# Patient Record
Sex: Female | Born: 1955 | Race: White | Hispanic: No | Marital: Married
Health system: Southern US, Community
[De-identification: ages and names within clinical notes are randomized; demographics above are authoritative.]

---

## 2011-06-16 ENCOUNTER — Ambulatory Visit: Payer: Self-pay | Admitting: Oncology

## 2011-07-17 ENCOUNTER — Ambulatory Visit: Payer: Self-pay | Admitting: Oncology

## 2011-07-20 LAB — CBC CANCER CENTER
Basophil #: 0.2 x10 3/mm — ABNORMAL HIGH (ref 0.0–0.1)
Eosinophil #: 0 x10 3/mm (ref 0.0–0.7)
Eosinophil %: 0.4 %
MCH: 30.9 pg (ref 26.0–34.0)
MCHC: 33.4 g/dL (ref 32.0–36.0)
MCV: 92 fL (ref 80–100)
Monocyte #: 0.8 x10 3/mm (ref 0.2–0.9)
Monocyte %: 10.3 %
Neutrophil %: 65 %
Platelet: 226 x10 3/mm (ref 150–440)
RBC: 4.41 10*6/uL (ref 3.80–5.20)
WBC: 7.5 x10 3/mm (ref 3.6–11.0)

## 2011-07-20 LAB — COMPREHENSIVE METABOLIC PANEL
Albumin: 3.8 g/dL (ref 3.4–5.0)
Alkaline Phosphatase: 106 U/L (ref 50–136)
BUN: 9 mg/dL (ref 7–18)
Chloride: 104 mmol/L (ref 98–107)
Creatinine: 0.66 mg/dL (ref 0.60–1.30)
EGFR (Non-African Amer.): >60
Glucose: 75 mg/dL (ref 65–99)
Osmolality: 275 (ref 275–301)
Potassium: 4 mmol/L (ref 3.5–5.1)
Total Protein: 7.7 g/dL (ref 6.4–8.2)

## 2011-08-16 ENCOUNTER — Ambulatory Visit: Payer: Self-pay | Admitting: Oncology

## 2011-09-02 ENCOUNTER — Ambulatory Visit: Payer: Self-pay | Admitting: Oncology

## 2011-09-16 ENCOUNTER — Ambulatory Visit: Payer: Self-pay | Admitting: Oncology

## 2011-10-13 ENCOUNTER — Emergency Department: Payer: Self-pay | Admitting: Emergency Medicine

## 2011-10-13 LAB — CBC
HCT: 40.4 % (ref 35.0–47.0)
HGB: 13.7 g/dL (ref 12.0–16.0)
MCHC: 33.8 g/dL (ref 32.0–36.0)
RBC: 4.48 10*6/uL (ref 3.80–5.20)
RDW: 13 % (ref 11.5–14.5)

## 2011-10-13 LAB — COMPREHENSIVE METABOLIC PANEL
Alkaline Phosphatase: 106 U/L (ref 50–136)
Anion Gap: 10 (ref 7–16)
BUN: 18 mg/dL (ref 7–18)
Co2: 22 mmol/L (ref 21–32)
Creatinine: 0.61 mg/dL (ref 0.60–1.30)
EGFR (African American): >60
Glucose: 97 mg/dL (ref 65–99)
SGOT(AST): 31 U/L (ref 15–37)
SGPT (ALT): 32 U/L (ref 12–78)
Sodium: 137 mmol/L (ref 136–145)
Total Protein: 7.1 g/dL (ref 6.4–8.2)

## 2011-10-14 LAB — CBC CANCER CENTER
Eosinophil #: 0 x10 3/mm (ref 0.0–0.7)
Eosinophil %: 0 %
Lymphocyte #: 1.1 x10 3/mm (ref 1.0–3.6)
MCV: 91 fL (ref 80–100)
Monocyte %: 1.4 %
Neutrophil %: 79.2 %
Platelet: 244 x10 3/mm (ref 150–440)
RBC: 4.53 10*6/uL (ref 3.80–5.20)
RDW: 13 % (ref 11.5–14.5)
WBC: 5.7 x10 3/mm (ref 3.6–11.0)

## 2011-10-14 LAB — COMPREHENSIVE METABOLIC PANEL
Albumin: 3.8 g/dL (ref 3.4–5.0)
Alkaline Phosphatase: 125 U/L (ref 50–136)
BUN: 22 mg/dL — ABNORMAL HIGH (ref 7–18)
EGFR (Non-African Amer.): >60
Glucose: 116 mg/dL — ABNORMAL HIGH (ref 65–99)
Osmolality: 286 (ref 275–301)
Potassium: 3.9 mmol/L (ref 3.5–5.1)
SGPT (ALT): 36 U/L (ref 12–78)
Sodium: 141 mmol/L (ref 136–145)
Total Protein: 7.5 g/dL (ref 6.4–8.2)

## 2011-10-17 ENCOUNTER — Ambulatory Visit: Payer: Self-pay | Admitting: Oncology

## 2011-10-21 LAB — COMPREHENSIVE METABOLIC PANEL
Albumin: 3.5 g/dL (ref 3.4–5.0)
Alkaline Phosphatase: 81 U/L (ref 50–136)
Anion Gap: 11 (ref 7–16)
BUN: 35 mg/dL — ABNORMAL HIGH (ref 7–18)
Bilirubin,Total: 0.4 mg/dL (ref 0.2–1.0)
Calcium, Total: 8.2 mg/dL — ABNORMAL LOW (ref 8.5–10.1)
Chloride: 110 mmol/L — ABNORMAL HIGH (ref 98–107)
Co2: 19 mmol/L — ABNORMAL LOW (ref 21–32)
Creatinine: 1.17 mg/dL (ref 0.60–1.30)
EGFR (African American): >60
EGFR (Non-African Amer.): 53 — ABNORMAL LOW
Glucose: 124 mg/dL — ABNORMAL HIGH (ref 65–99)
Osmolality: 289 (ref 275–301)
Potassium: 3.7 mmol/L (ref 3.5–5.1)
SGOT(AST): 12 U/L — ABNORMAL LOW (ref 15–37)
SGPT (ALT): 18 U/L (ref 12–78)
Sodium: 140 mmol/L (ref 136–145)
Total Protein: 6.3 g/dL — ABNORMAL LOW (ref 6.4–8.2)

## 2011-10-21 LAB — CBC CANCER CENTER
Basophil #: 0.1 x10 3/mm (ref 0.0–0.1)
Eosinophil %: 0.1 %
HGB: 14.2 g/dL (ref 12.0–16.0)
MCV: 92 fL (ref 80–100)
Monocyte %: 4.5 %
Neutrophil #: 10.9 x10 3/mm — ABNORMAL HIGH (ref 1.4–6.5)
Neutrophil %: 89 %
Platelet: 257 x10 3/mm (ref 150–440)
RBC: 4.78 10*6/uL (ref 3.80–5.20)
RDW: 13.2 % (ref 11.5–14.5)
WBC: 12.2 x10 3/mm — ABNORMAL HIGH (ref 3.6–11.0)

## 2011-10-23 ENCOUNTER — Observation Stay: Payer: Self-pay | Admitting: Internal Medicine

## 2011-10-23 LAB — URINALYSIS, COMPLETE
Glucose,UR: NEGATIVE mg/dL (ref 0–75)
Ketone: NEGATIVE
Nitrite: NEGATIVE
Protein: NEGATIVE
RBC,UR: NONE SEEN /HPF (ref 0–5)
Specific Gravity: 1.011 (ref 1.003–1.030)
WBC UR: 12 /HPF (ref 0–5)

## 2011-10-23 LAB — CBC WITH DIFFERENTIAL/PLATELET
Basophil #: 0 10*3/uL (ref 0.0–0.1)
Basophil %: 0.2 %
Eosinophil %: 0 %
HCT: 40.2 % (ref 35.0–47.0)
HGB: 13.8 g/dL (ref 12.0–16.0)
Lymphocyte %: 1.6 %
MCH: 31.4 pg (ref 26.0–34.0)
Monocyte %: 4.5 %
Neutrophil #: 22.8 10*3/uL — ABNORMAL HIGH (ref 1.4–6.5)
Neutrophil %: 93.7 %
RBC: 4.41 10*6/uL (ref 3.80–5.20)

## 2011-10-23 LAB — CK TOTAL AND CKMB (NOT AT ARMC)
CK, Total: 67 U/L (ref 21–215)
CK, Total: 71 U/L (ref 21–215)
CK-MB: 0.5 ng/mL (ref 0.5–3.6)
CK-MB: 2.3 ng/mL (ref 0.5–3.6)

## 2011-10-23 LAB — RAPID URINE DRUG SCREEN, HOSP PERFORMED
Barbiturates, Ur Screen: NEGATIVE (ref ?–200)
Benzodiazepine, Ur Scrn: POSITIVE (ref ?–200)
Cocaine Metabolite,Ur ~~LOC~~: NEGATIVE (ref ?–300)

## 2011-10-23 LAB — COMPREHENSIVE METABOLIC PANEL
BUN: 45 mg/dL — ABNORMAL HIGH (ref 7–18)
Bilirubin,Total: 0.4 mg/dL (ref 0.2–1.0)
Chloride: 108 mmol/L — ABNORMAL HIGH (ref 98–107)
Creatinine: 1.29 mg/dL (ref 0.60–1.30)
EGFR (African American): 54 — ABNORMAL LOW
EGFR (Non-African Amer.): 47 — ABNORMAL LOW
Osmolality: 298 (ref 275–301)
Potassium: 5 mmol/L (ref 3.5–5.1)
SGPT (ALT): 20 U/L (ref 12–78)
Total Protein: 6.3 g/dL — ABNORMAL LOW (ref 6.4–8.2)

## 2011-10-23 LAB — ETHANOL: Ethanol %: <0.003 % (ref 0.000–0.080)

## 2011-10-23 LAB — TROPONIN I
Troponin-I: <0.02 ng/mL
Troponin-I: <0.02 ng/mL

## 2011-10-24 LAB — CBC WITH DIFFERENTIAL/PLATELET
Basophil #: 0 10*3/uL (ref 0.0–0.1)
Eosinophil %: 0.1 %
HGB: 13.1 g/dL (ref 12.0–16.0)
MCH: 30.9 pg (ref 26.0–34.0)
Monocyte #: 0.1 x10 3/mm — ABNORMAL LOW (ref 0.2–0.9)
Monocyte %: 1.3 %
Neutrophil %: 94.3 %
Platelet: 174 10*3/uL (ref 150–440)
RBC: 4.22 10*6/uL (ref 3.80–5.20)
RDW: 13 % (ref 11.5–14.5)
WBC: 10.2 10*3/uL (ref 3.6–11.0)

## 2011-10-24 LAB — BASIC METABOLIC PANEL
Anion Gap: 4 — ABNORMAL LOW (ref 7–16)
BUN: 33 mg/dL — ABNORMAL HIGH (ref 7–18)
Chloride: 104 mmol/L (ref 98–107)
Creatinine: 0.75 mg/dL (ref 0.60–1.30)
EGFR (Non-African Amer.): >60
Osmolality: 282 (ref 275–301)
Potassium: 5.3 mmol/L — ABNORMAL HIGH (ref 3.5–5.1)
Sodium: 137 mmol/L (ref 136–145)

## 2011-10-24 LAB — MAGNESIUM: Magnesium: 2.1 mg/dL

## 2011-10-26 ENCOUNTER — Ambulatory Visit: Payer: Self-pay | Admitting: Vascular Surgery

## 2011-10-28 LAB — COMPREHENSIVE METABOLIC PANEL
Albumin: 3.2 g/dL — ABNORMAL LOW (ref 3.4–5.0)
Alkaline Phosphatase: 63 U/L (ref 50–136)
Anion Gap: 6 — ABNORMAL LOW (ref 7–16)
BUN: 29 mg/dL — ABNORMAL HIGH (ref 7–18)
EGFR (Non-African Amer.): >60
Glucose: 91 mg/dL (ref 65–99)
Potassium: 4 mmol/L (ref 3.5–5.1)
SGOT(AST): 17 U/L (ref 15–37)
Sodium: 138 mmol/L (ref 136–145)
Total Protein: 6 g/dL — ABNORMAL LOW (ref 6.4–8.2)

## 2011-10-28 LAB — CULTURE, BLOOD (SINGLE)

## 2011-10-28 LAB — CBC CANCER CENTER
Basophil #: 0 x10 3/mm (ref 0.0–0.1)
Eosinophil #: 0 x10 3/mm (ref 0.0–0.7)
Eosinophil %: 0 %
HCT: 34.1 % — ABNORMAL LOW (ref 35.0–47.0)
HGB: 11.3 g/dL — ABNORMAL LOW (ref 12.0–16.0)
Lymphocyte #: 0.6 x10 3/mm — ABNORMAL LOW (ref 1.0–3.6)
Lymphocyte %: 8.1 %
MCHC: 33 g/dL (ref 32.0–36.0)
Monocyte #: 0.3 x10 3/mm (ref 0.2–0.9)
Neutrophil %: 88.4 %
Platelet: 163 x10 3/mm (ref 150–440)
RBC: 3.74 10*6/uL — ABNORMAL LOW (ref 3.80–5.20)
RDW: 12.6 % (ref 11.5–14.5)
WBC: 7.6 x10 3/mm (ref 3.6–11.0)

## 2011-11-04 LAB — CBC CANCER CENTER
Basophil #: 0 x10 3/mm (ref 0.0–0.1)
Basophil %: 0.4 %
Eosinophil #: 0 x10 3/mm (ref 0.0–0.7)
Eosinophil %: 0 %
HCT: 33.1 % — ABNORMAL LOW (ref 35.0–47.0)
HGB: 11 g/dL — ABNORMAL LOW (ref 12.0–16.0)
MCH: 30.8 pg (ref 26.0–34.0)
Monocyte #: 0.5 x10 3/mm (ref 0.2–0.9)
Monocyte %: 12.7 %
Neutrophil %: 75.7 %
Platelet: 117 x10 3/mm — ABNORMAL LOW (ref 150–440)
RBC: 3.57 10*6/uL — ABNORMAL LOW (ref 3.80–5.20)
WBC: 4 x10 3/mm (ref 3.6–11.0)

## 2011-11-16 ENCOUNTER — Ambulatory Visit: Payer: Self-pay | Admitting: Oncology

## 2011-11-18 LAB — COMPREHENSIVE METABOLIC PANEL
Albumin: 2.9 g/dL — ABNORMAL LOW (ref 3.4–5.0)
Alkaline Phosphatase: 76 U/L (ref 50–136)
BUN: 20 mg/dL — ABNORMAL HIGH (ref 7–18)
Bilirubin,Total: 0.2 mg/dL (ref 0.2–1.0)
Creatinine: 0.82 mg/dL (ref 0.60–1.30)
EGFR (Non-African Amer.): >60
Glucose: 88 mg/dL (ref 65–99)
Osmolality: 283 (ref 275–301)
SGPT (ALT): 34 U/L (ref 12–78)
Sodium: 141 mmol/L (ref 136–145)
Total Protein: 6.4 g/dL (ref 6.4–8.2)

## 2011-11-18 LAB — CBC CANCER CENTER
Eosinophil %: 0 %
HGB: 10.8 g/dL — ABNORMAL LOW (ref 12.0–16.0)
Lymphocyte #: 1.9 x10 3/mm (ref 1.0–3.6)
Monocyte %: 10.9 %
Neutrophil #: 6.6 x10 3/mm — ABNORMAL HIGH (ref 1.4–6.5)
Neutrophil %: 68.7 %
Platelet: 141 x10 3/mm — ABNORMAL LOW (ref 150–440)
RBC: 3.57 10*6/uL — ABNORMAL LOW (ref 3.80–5.20)
RDW: 14.7 % — ABNORMAL HIGH (ref 11.5–14.5)
WBC: 9.6 x10 3/mm (ref 3.6–11.0)

## 2011-11-25 LAB — COMPREHENSIVE METABOLIC PANEL
Anion Gap: 13 (ref 7–16)
BUN: 12 mg/dL (ref 7–18)
Calcium, Total: 8.6 mg/dL (ref 8.5–10.1)
Co2: 23 mmol/L (ref 21–32)
Creatinine: 0.76 mg/dL (ref 0.60–1.30)
EGFR (African American): >60
EGFR (Non-African Amer.): >60
Osmolality: 286 (ref 275–301)
Potassium: 3.6 mmol/L (ref 3.5–5.1)
Sodium: 144 mmol/L (ref 136–145)

## 2011-11-25 LAB — CBC CANCER CENTER
Basophil #: 0 x10 3/mm (ref 0.0–0.1)
Basophil %: 0.6 %
Eosinophil %: 0.1 %
Lymphocyte #: 1.3 x10 3/mm (ref 1.0–3.6)
Monocyte %: 6.8 %
Neutrophil %: 67.5 %
Platelet: 127 x10 3/mm — ABNORMAL LOW (ref 150–440)
RBC: 3.27 10*6/uL — ABNORMAL LOW (ref 3.80–5.20)

## 2011-12-17 ENCOUNTER — Ambulatory Visit: Payer: Self-pay | Admitting: Oncology

## 2011-12-17 DEATH — deceased

## 2012-10-03 IMAGING — CT NM PET TUM IMG RESTAG (PS) SKULL BASE T - THIGH
5 series · 25 of 25 positions shown · non-contrast
Comparison: none

REASON FOR EXAM: hx of lung CA  pulmonary nodule
COMMENTS:

[Series 3: ct wb 3.0 b30f · axial · 3.0mm · 0.98mm/px · z∈[-1379,-511]mm · 11 of 435 slices shown]
[im 1/435]
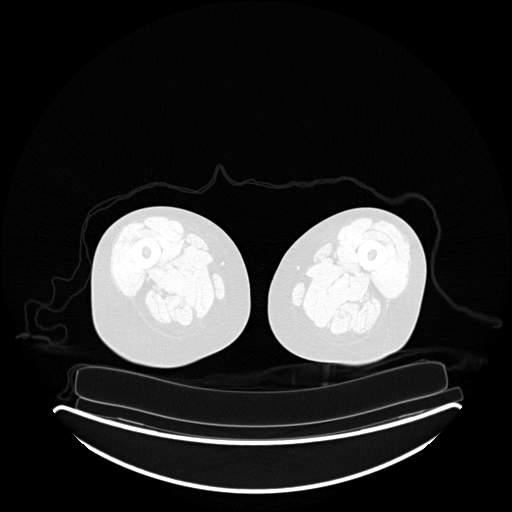
[im 44/435]
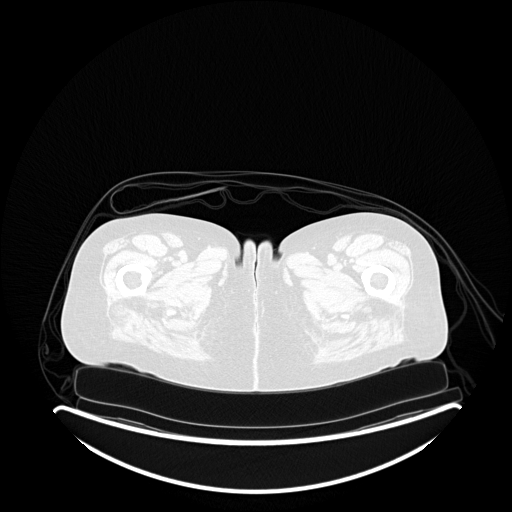
[im 87/435]
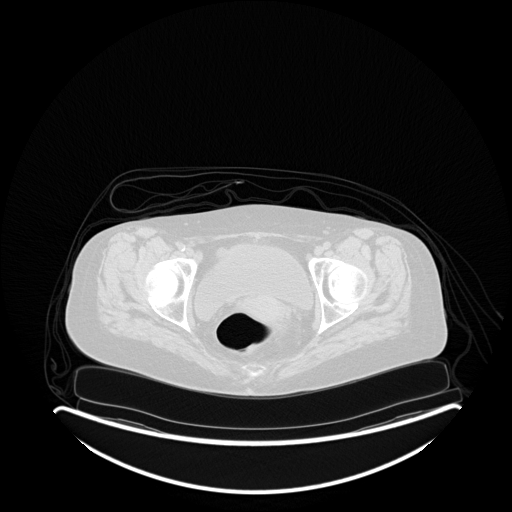
[im 131/435]
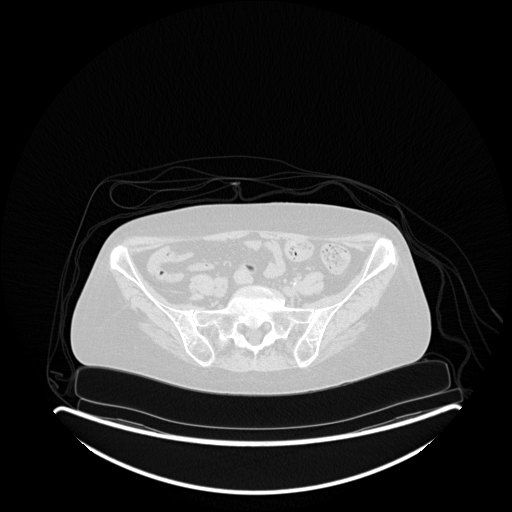
[im 174/435]
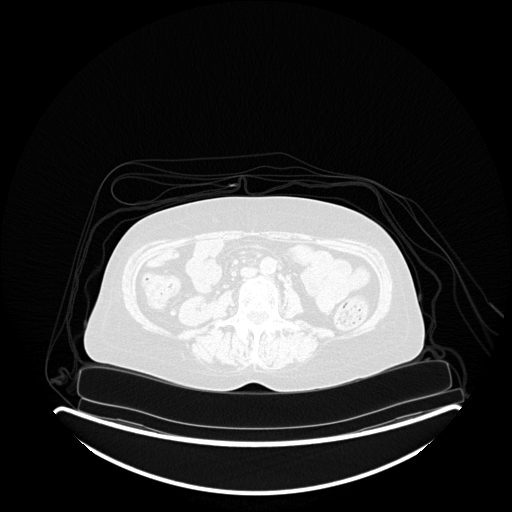
[im 218/435]
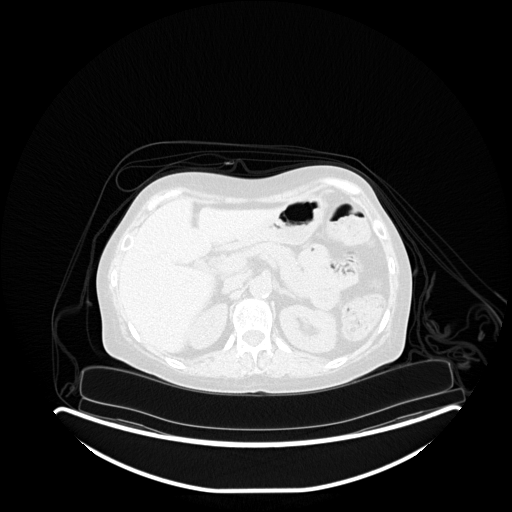
[im 261/435]
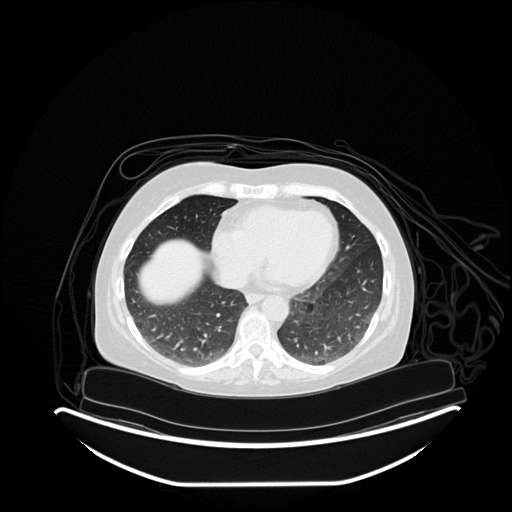
[im 304/435]
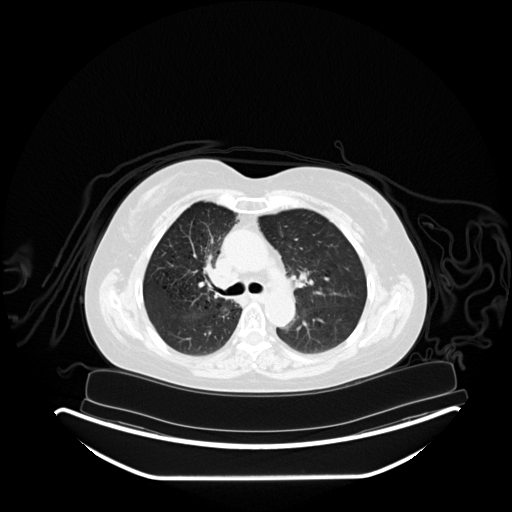
[im 348/435]
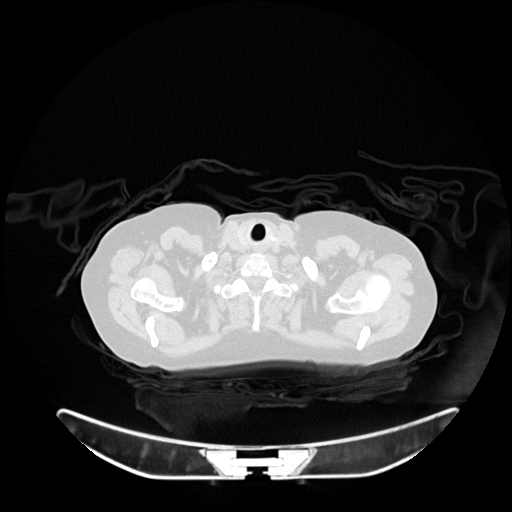
[im 391/435]
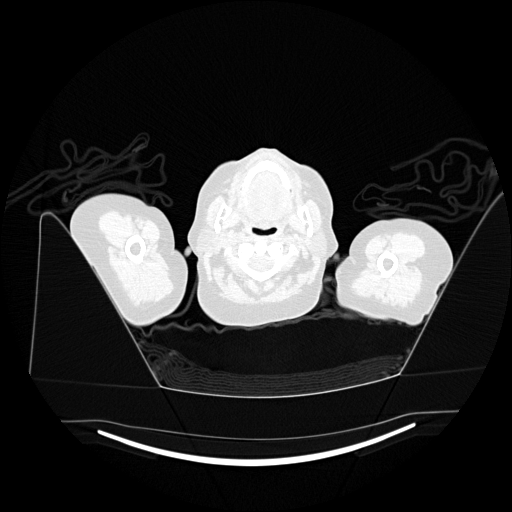
[im 435/435]
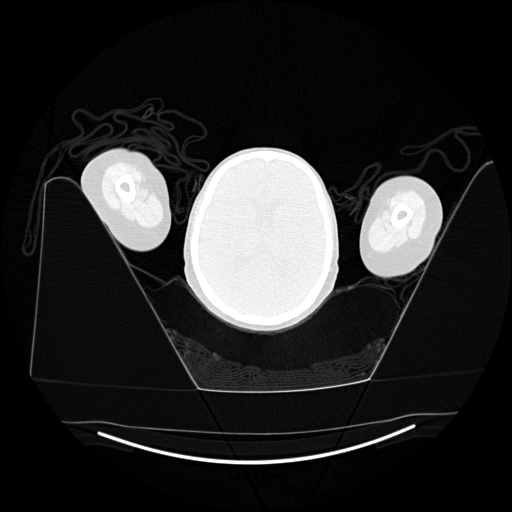

[Series 102: pet wb · axial · 5.0mm · 4.07mm/px · z∈[-1378,-511]mm · 7 of 290 slices shown]
[im 1/290]
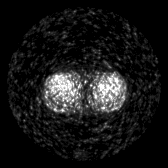
[im 49/290]
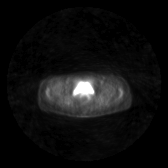
[im 97/290]
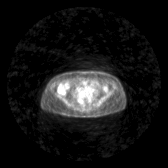
[im 145/290]
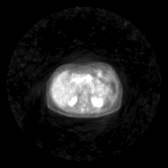
[im 193/290]
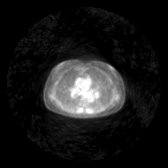
[im 241/290]
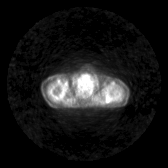
[im 290/290]
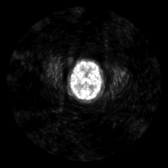

[Series 803: pet axial · 4 of 167 slices shown]
[im 1/167]
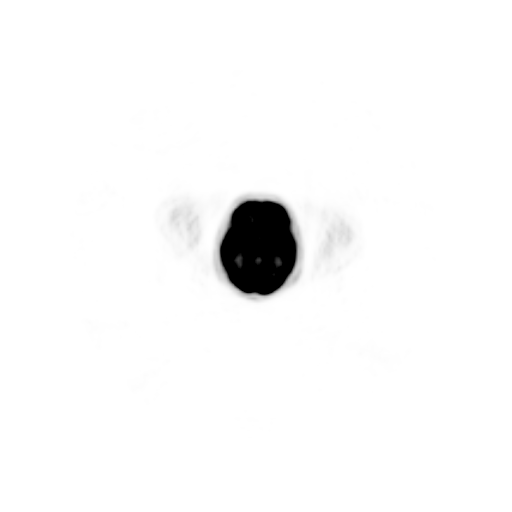
[im 56/167]
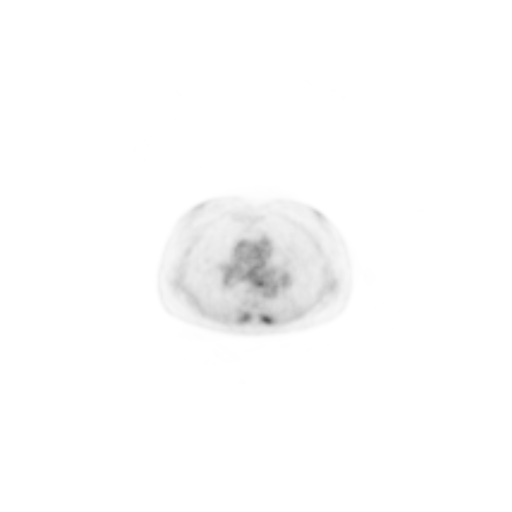
[im 111/167]
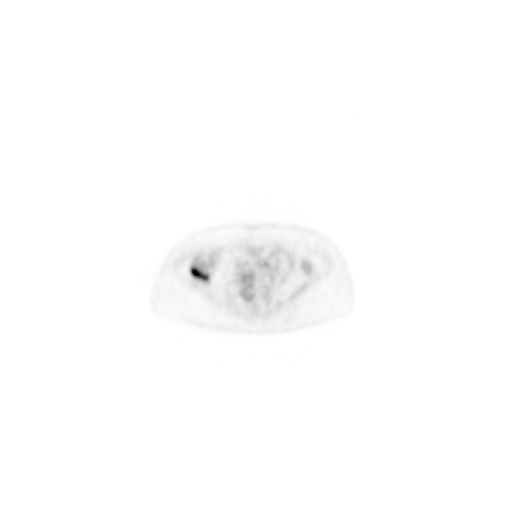
[im 167/167]
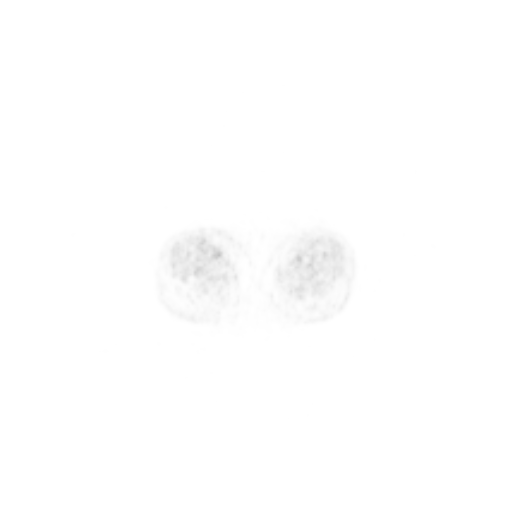

[Series 804: pet coronal · 1 of 47 slices shown]
[im 1/47]
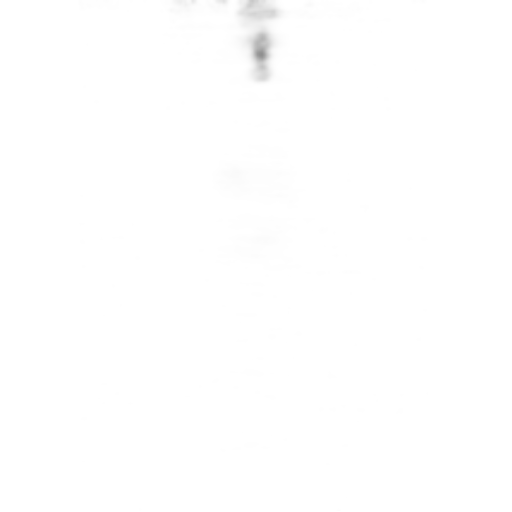

[Series 805: pet sagittal · 2 of 65 slices shown]
[im 1/65]
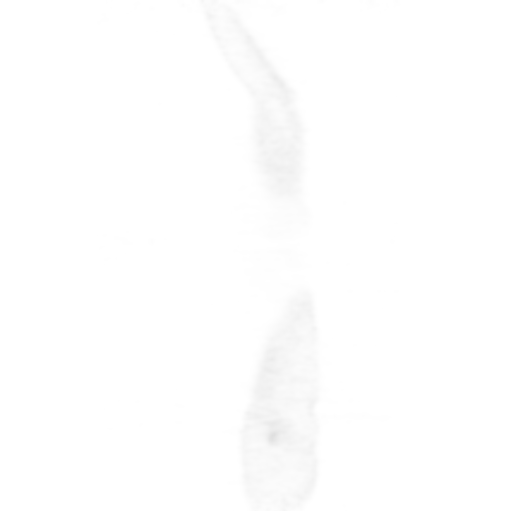
[im 65/65]
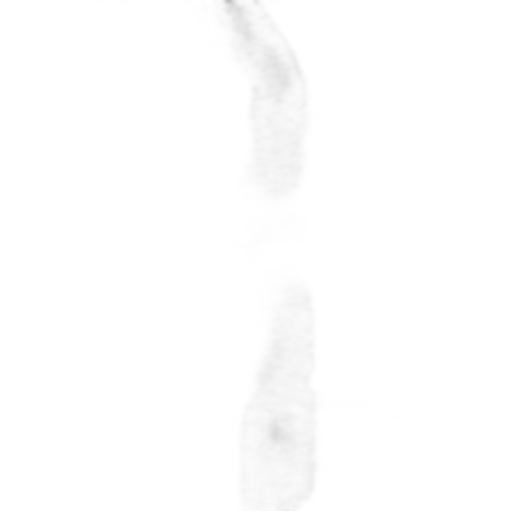

[25 of 25 positions shown; findings below may reference images not displayed]

PROCEDURE:     PET - PET/CT RESTAGING LUNG CA  - September 02, 2011 [DATE]

RESULT:

PROCEDURE:  Total Body PET was performed in conjunction with a nonattenuated
CT status post left hand injection of 11.33 mCi of F18-FDG. Injection time
was at [DATE], scan start time of [DATE] and scan end time of [DATE].
FINDINGS: Appropriate bio-distribution is identified in the region of the
heart, base of the brain, liver, spleen, bowel and urinary bladder. A focal
area of FDG avid hypermetabolic activity projects within the posterior apex
of the right upper lobe. This correlates with a spiculated nodule and
demonstrates an SUV of 5.54. There are no further regions of abnormal
hypermetabolic activity identified. Evaluation of the lung windows of the
chest demonstrates a small, 4.7 mm nodule along the lateral aspect of the
anterior segment of the right upper lobe. No further regions of abnormal
hypermetabolic activity are identified.
IMPRESSION: 1.  FDG avid spiculated nodule within the right upper lobe consistent with
neoplastic disease until otherwise particularly in the absence of known
infection or inflammatory diseases.
2.  Small, 4.7 mm nodule in the right upper lobe as described above.

## 2012-11-13 IMAGING — CT CT HEAD WITHOUT CONTRAST
2 series · 15 of 30 positions shown, 19 images · non-contrast
Comparison: none

REASON FOR EXAM: ataxia
COMMENTS:

PROCEDURE:     CT  - CT HEAD WITHOUT CONTRAST  - October 13, 2011  [DATE]
RESULT:     Comparison:  None
TECHNIQUE: Multiple axial images from the foramen magnum to the vertex were
obtained without IV contrast.

[Series 2: without · axial · non-contrast · 0.40mm/px · z∈[-184,-58]mm · 13 of 31 slices shown, 17 images]
[im 3/31  brain]
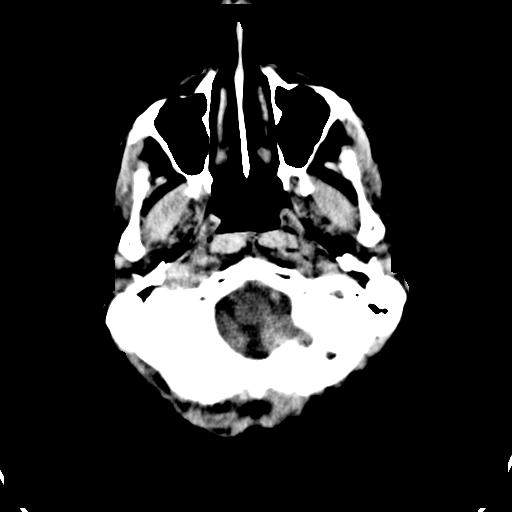
[im 3/31  bone]
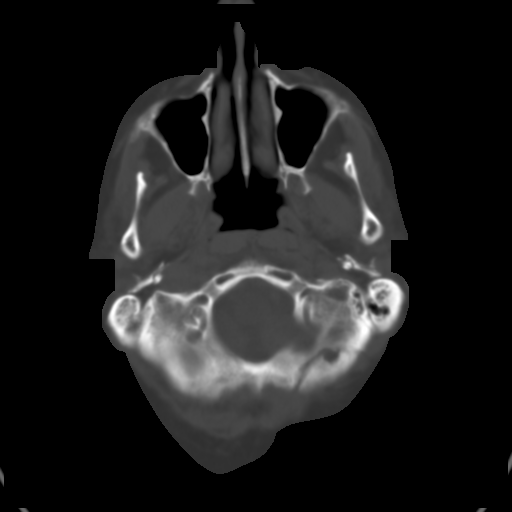
[im 5/31  brain]
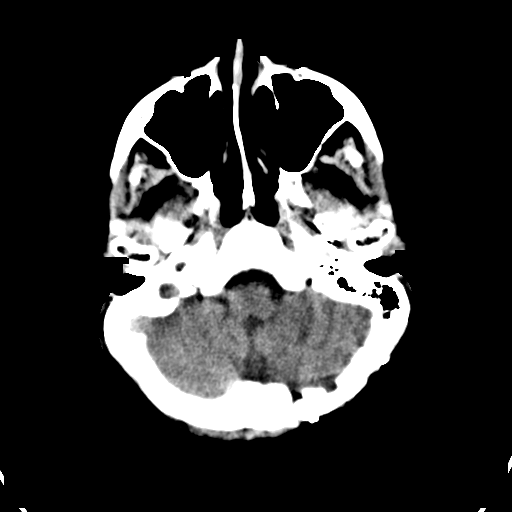
[im 7/31  brain]
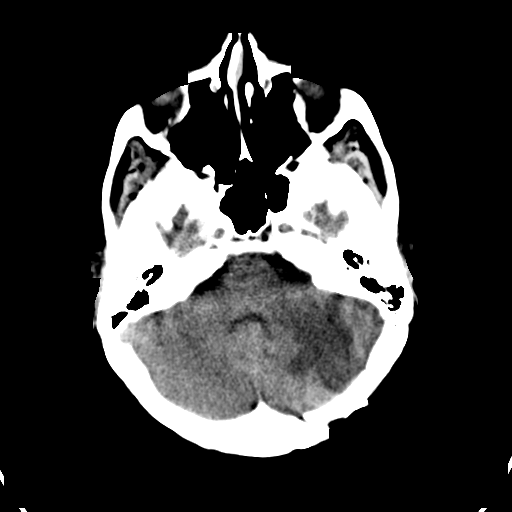
[im 9/31  brain]
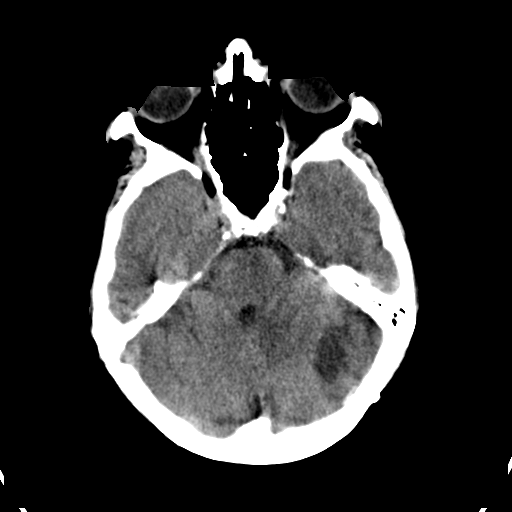
[im 11/31  brain]
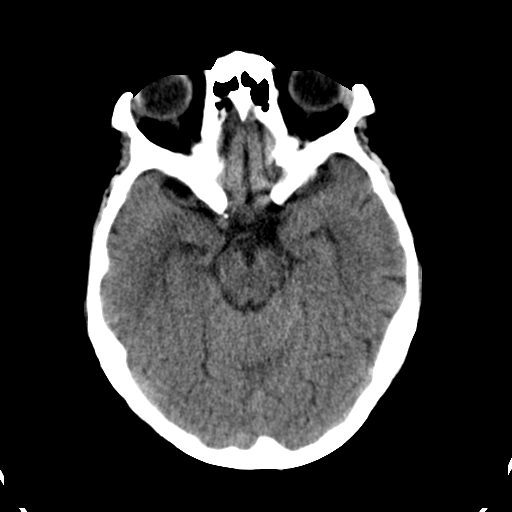
[im 11/31  bone]
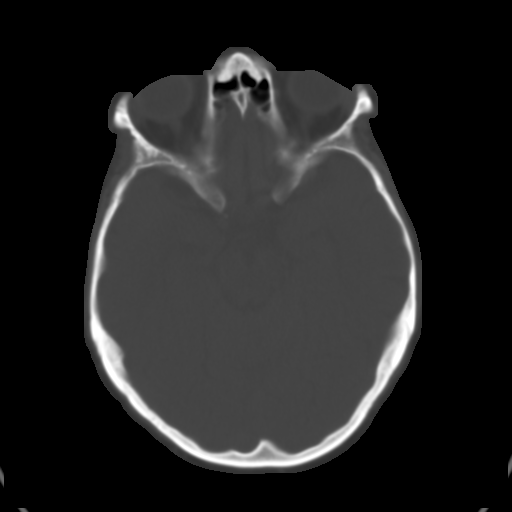
[im 13/31  brain]
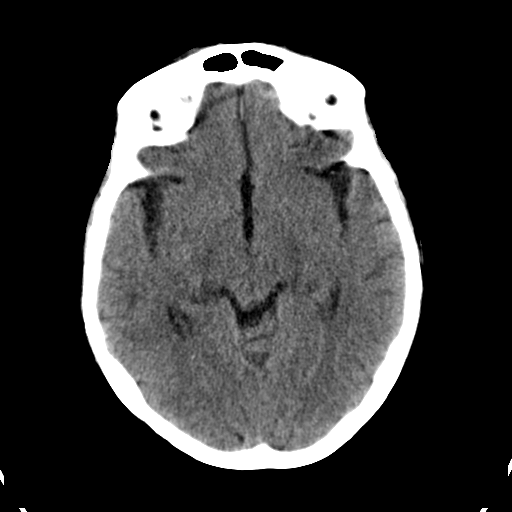
[im 16/31  brain]
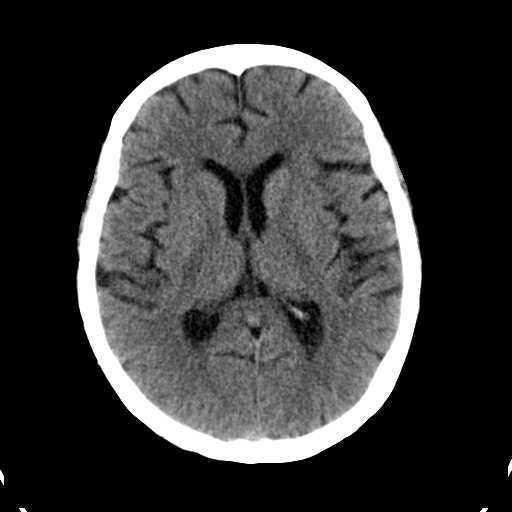
[im 18/31  brain]
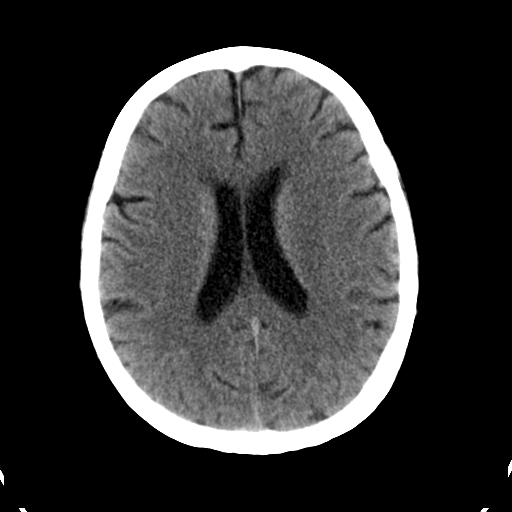
[im 20/31  brain]
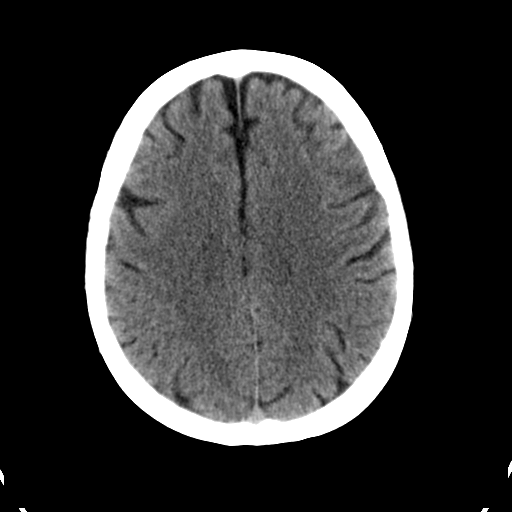
[im 20/31  bone]
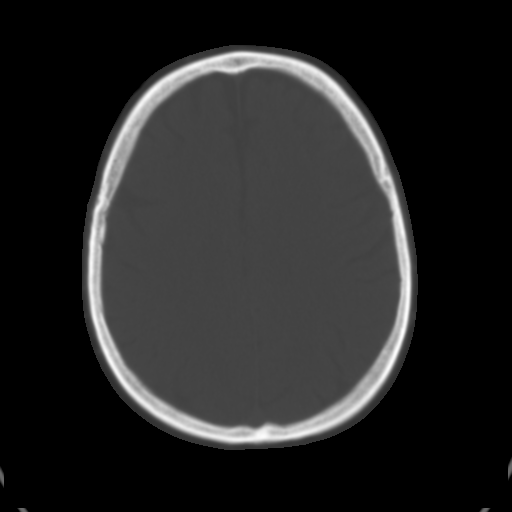
[im 22/31  brain]
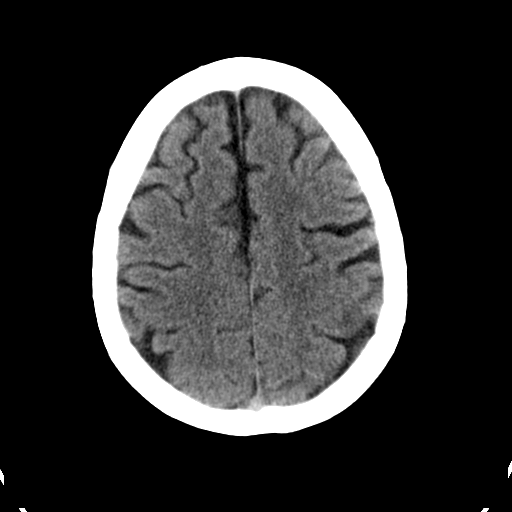
[im 24/31  brain]
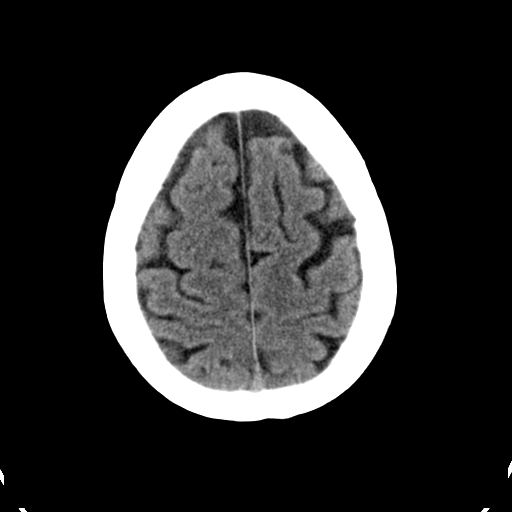
[im 26/31  brain]
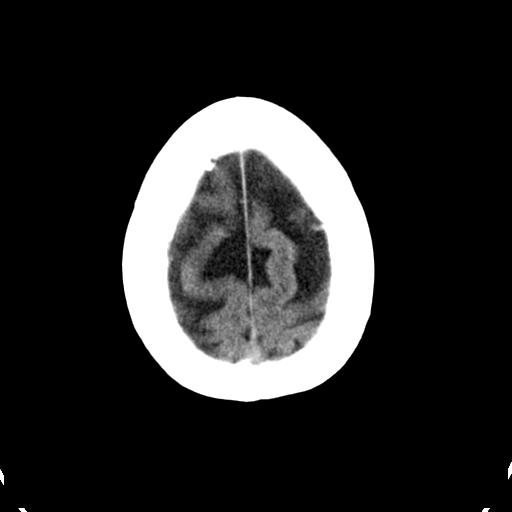
[im 28/31  brain]
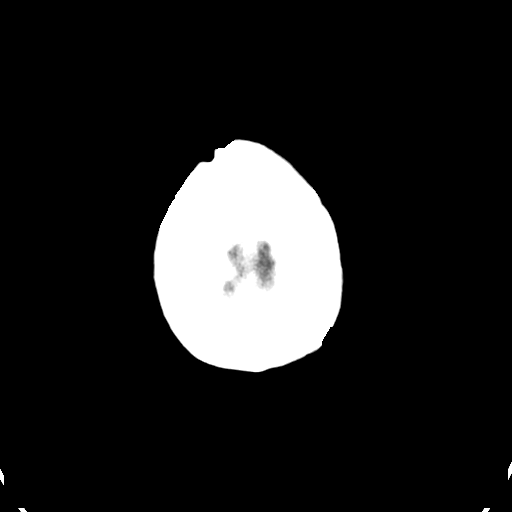
[im 28/31  bone]
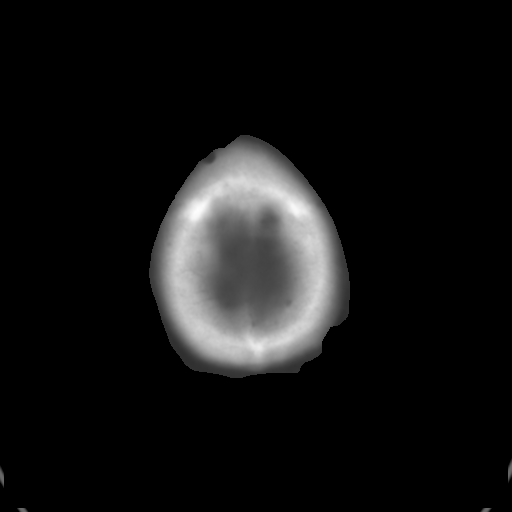

[Series 3: bone · axial · 0.40mm/px · z∈[-184,-164]mm · 2 of 31 slices shown]
[im 3/31  bone]
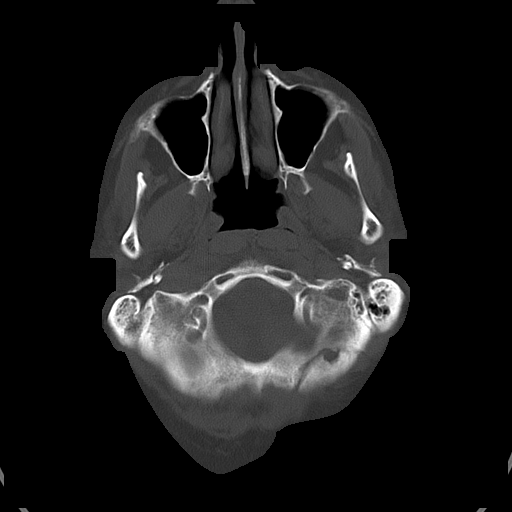
[im 7/31  bone]
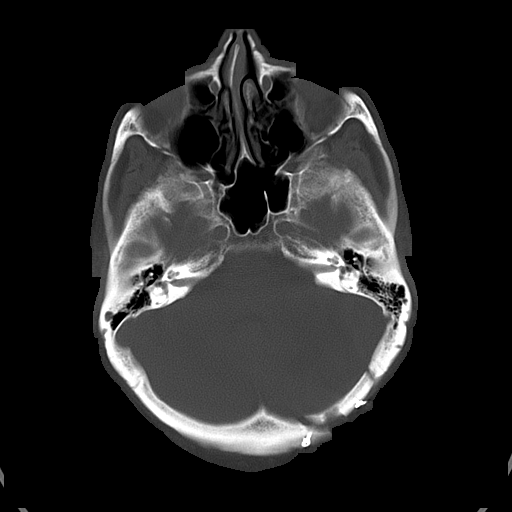

[15 of 30 positions shown; findings below may reference images not displayed]

FINDINGS: There is ill-defined hypoattenuation in the left cerebellar hemisphere.
There was a peripherally enhancing cystic lesion in this region on the prior
MRI. This area of hypoattenuation on today's study appears slightly greater
in dimension than the abnormality seen on prior MRI, but this comparison is
difficult secondary to differences in technique. There may be some
hypoattenuation within the pons as well.

No intracranial hemorrhage or extra-axial fluid collection seen. No midline
shift.

Changes of prior craniotomy seen in the left occipital skull.
IMPRESSION: 1. No intracranial hemorrhage.
2. Ill-defined hypoattenuation in left cerebellum is in the region of the
previously demonstrated mass. This hypoattenuation appears slightly larger
in dimension than prior MRI. This may be related to differences in technique
when comparing to the MRI versus treatment related changes versus interval
increase in size of the lesion. Further evaluation with contrast-enhanced
brain MRI is suggested.
3. Small focus of hypoattenuation within the pons may be artifactual versus
other etiology such as an age-indeterminate infarct. This could be also
evaluated on the aforementioned MRI.

[REDACTED]

## 2014-06-04 NOTE — H&P (Signed)
PATIENT NAME:  Cheryl [name], JEWEL MR#:  [id number] DATE OF BIRTH:  [birth year]  DATE OF ADMISSION:  10/23/2011  PRIMARY CARE PHYSICIAN: Dr. Fran Lowes   ONCOLOGIST: Dr. Orlie Dakin   ER PHYSICIAN: Dr. Lavella Lemons    ADMITTING PHYSICIAN: Dr. Tilda Franco    PRESENTING COMPLAINT: Altered mental status.   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 This evening the son called home to check on her and found her unresponsive. She said she had been sleeping all evening. At this time he noted fentanyl patch on her leg and took that off, activated EMS, and she was brought to the Emergency Room. EMS found another fentanyl patch on her other leg and took that off. Here in the Emergency Room another fentanyl patch was found on her back and this was taken off and during examination after getting Narcan the patient woke up and spit out another fentanyl patch from her mouth. For this she was referred to the hospitalist for further evaluation. Work-up so far has been negative. The patient is currently awake on Narcan drip. Denies any suicidal or homicidal ideation. Admits to being depressed but was in pain, remembers putting on two fentanyl patches on the legs but does not recall anything after that. Denies any PND, orthopnea, or pedal edema.   REVIEW OF SYSTEMS: CONSTITUTIONAL: Denies fever. Admits to fatigue, weakness. No weight loss or weight gain. EYES: No blurred vision, redness, or discharge. ENT: No tinnitus, epistaxis, or difficulty swallowing. RESPIRATORY: Denies any cough, shortness of breath, or PND. CARDIOVASCULAR: Denies any PND, orthopnea, pedal edema, or palpitations. GI: No nausea, vomiting, or diarrhea. No change in bowel habits. GU: No dysuria, frequency, or incontinence. ENDOCRINE: No polyuria, polydipsia, heat or cold intolerance. HEMATOLOGIC: No anemia, easy bruising, bleeding, or swollen glands.  SKIN: No rashes, change in hair or skin texture. MUSCULOSKELETAL: No joint pain, redness, swelling, or limited activity. NEUROLOGIC: Denies any numbness, weakness, or tremors but admits to increased confusion and occasionally has word finding difficulty. Admits at times recently frequent falls. PSYCH: Admits to being depressed but not homicidal or suicidal.   PAST MEDICAL HISTORY: 1. History of spinal stenosis, T1 compression fracture.  2. History of hypertension.  3. Bipolar disorder.  4. Hyperlipidemia.  5. Type II diabetes.  6. History of cancer of the lung adenocarcinoma with brain metastasis status post chemotherapy one year ago and currently started palliative chemotherapy yesterday. 7. 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   PAST SURGICAL HISTORY:  1. Tonsillectomy.  2. BTL.  3. Sacral surgery.  4. Brain surgery for cerebellar mass removal about one year ago status post radiotherapy.   SOCIAL HISTORY: Lives at home with her son. Previously house cleaner by profession. Denied any other recreational drug use apart from tobacco. Has a 50-pack-year history of smoking. The patient said now she is down to 1 to 2 cigarettes per day. Prior history of alcohol misuse, quit in 2004.   FAMILY HISTORY: Positive for lung cancer in the mother who died at 28. Brain aneurysm in the father. Colon cancer in paternal grandfather.   ALLERGIES: Ibuprofen causes nausea and vomiting with GI distress.  MEDICATIONS:  1. Dexamethasone 4 mg 4 times a day.  2. Duragesic patch 100 mcg q.72 hours. 3. Oxycodone 10 mg q.6 hours p.r.n. pain.  4. Valium 10 mg twice daily p.r.n. anxiety. 5. Lexapro 20 mg once daily. 6.  Promethazine 25 mg q.6 hours.  7. Pravastatin 20 mg at bedtime.  8. Metoprolol 25 mg twice daily.  9. Amlodipine 5 mg once a day. 10. Ranitidine 75 mg daily.   PHYSICAL EXAMINATION:   VITAL SIGNS: Temperature 99.3, pulse 75, respiratory rate 16, blood pressure 126/76, oxygen sat 98  on room air.   GENERAL: Middle-aged lady looking older than stated age lying on the gurney awake, alert, oriented to time, place, and person in no overt distress. The patient is intermittently crying. Admits to being depressed but not suicidal or homicidal.   HEENT: Normocephalic. Pupils equal, reactive to light and accommodation. Extraocular movements intact. Mucous membranes pink, moist.   NECK: Supple. No JV distention.   CHEST: Good air entry with transmitted breath sounds. No rhonchi or rales.   HEART: Regular rate and rhythm. No murmur.   ABDOMEN: Full, moves with respiration, nontender. Bowel sounds normoactive. No organomegaly.   EXTREMITIES: No edema. No clubbing. No deformity.   NEUROLOGICAL: No focal motor or sensory deficits. Is able to move all limbs. Currently fully alert and awake.  LABORATORY, DIAGNOSTIC, AND RADIOLOGICAL DATA: EKG showed normal sinus rhythm, rate of 78.  Head CT from one week ago showed no intracranial bleed and left cerebellar hypoattenuation.   PET scan from two months ago showed right upper lobe spiculated mass in the lungs.   CBC white count 24 up from 5 from one week ago, hemoglobin 13.8, platelets 220, neutrophil count 93. Chemistry sodium 144, potassium 5, creatinine 1.2, BUN 45, glucose 85, calcium 7.5. Alcohol level was negative. Normal LFTs. CK 64.   IMPRESSION:  1. Altered mental status now resolved most likely secondary to opiate overdose, accidental overdose. 2. Transient respiratory failure secondary to #1 above.  3. Leukocytosis, not otherwise specified, questionable reaction to the chemotherapy. Has no obvious sign of infection at this time.  4. Hypocalcemia, not otherwise specified.  5. History of lung cancer, stage IV, with mets to the brain, no recurrence noted, on chemotherapy.  6. Hypertension, stable.  7. Type II diabetes, stable.  8. Hyperlipidemia, stable.  9. Depression.   PLAN:  1. Admit to general medical floor  telemetry under observation for telemonitoring tonight.  2. Supportive care.  3. Discontinue fentanyl.   4. Continue oxycodone.  5. Respiratory support as needed.  6. GI prophylaxis with Protonix. 7. DVT prophylaxis with sub-Q heparin.  8. Follow-up with Dr. Orlie DakinFinnegan in the a.m.  9. Consider psych evaluation before discharge in view of her depression.  10. We will continue Narcan drip for now.  CODE STATUS: FULL CODE.   TOTAL PATIENT CARE TIME: 50 minutes.    ____________________________ Floy SabinaMarcel I. Tilda FrancoAkuneme, MD mia:drc D: 10/23/2011 04:02:15 ET T: 10/23/2011 08:54:21 ET JOB#: [id number]  cc: Abrahim Sargent I. Tilda FrancoAkuneme, MD, <Dictator> Margaret PyleMARCEL I Manasvi Dickard MD ELECTRONICALLY SIGNED 10/23/2011 20:07

## 2014-06-04 NOTE — Discharge Summary (Signed)
PATIENT NAME:  Cheryl [name], Cheryl [name] MR#:  [id number] DATE OF BIRTH:  [birth year]  DATE OF ADMISSION:  10/23/2011 DATE OF DISCHARGE:  10/24/2011  DISCHARGE DIAGNOSIS:  1. Altered mental status due to fentanyl overdose.  2. Lung cancer with mets.  3. Hypertension.  4. Depression. 5. Hyperlipidemia.  6. Type 2 diabetes mellitus.   CONDITION ON DISCHARGE: Good.   CODE STATUS: FULL CODE.  PROCEDURES PERFORMED: None.  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 She lives with her son. Her son called home and found she was not responding. He found her at home unresponsive and he called the ambulance. EMS noticed a fentanyl patch on her leg and on her back. They brought her to the Emergency Room and she was given an injection of Narcan. She woke up after the injection and she spit out one more fentanyl patch from her mouth. On further questioning in the ER she admitted due to pain in the knees and in the back she applied two patches on her body by mistake and while trying to find one dropped fentanyl patch on the floor, she put it in her mouth by mistake and then she does not know what happened. She became unconscious.   HOSPITAL COURSE: 1. Altered mental status and consciousness secondary to fentanyl overdose. She was admitted for observation with Narcan drip. After the initial few hours she remained awake without any need of Narcan. She was continued on observation on the medical floor. Initially she was a little confused and forgetful, but later on she improved. On further questioning in the presence of her son, she denied clearly having any suicidal ideation or any thought of suicide behind this episode.  We still called a psychiatry consult to adjust her depression medications, and they also confirmed no suicidal ideation and recommended just maintaining her psychiatric medications. She was discharged home  safely with change in her pain medication.  2. 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 She was discontinued from fentanyl and her pain was managed by oxycodone. She was discharged with the same.  3. Depression. As per recommendation by psychiatrist, she was discharged home with Valium 10 mg and escitalopram 20 mg.  4. Hypertension. She was discharged on her home medications amlodipine and metoprolol.  5. Hyperlipidemia. She discharged home with the same home medications. Pravastatin.  6. Diabetes mellitus. Her blood sugar level was under control during the hospital stay without any need of medication and she also confirmed that it is under control with dietary measures only, so there was no medication prescribed on discharge.   MEDICATION RECONCILIATION/ DISCHARGE MEDICATIONS: The patient was discharged home on the following medications: 1. Metoprolol 25 mg 1 tablet 2 times a day. 2. Ranitidine 75 mg 2 times a day.  3. Pravastatin 20 mg once a day. 4. Dexamethasone 4 mg 4 times a day.  5. Amlodipine 5 mg once a day.  6. Oxycodone 10 mg every six hours as needed for pain. 7. Valium 10 mg 2 times a day.  8. Escitalopram 20 mg once a day.  DIET: Advised low sodium, low fat, low cholesterol, and carbohydrate controlled ADA diet, consistency regular.   ACTIVITY LIMITATIONS: None.   REFERRALS:  None.  FOLLOWUP: Follow-up appointment within 1 to 2 weeks with her primary physician, Dr. Denita Lung.  ____________________________ Hope Pigeon. Elisabeth Pigeon, MD vgv:bjt D:  10/24/2011 16:23:15 ET  T: 10/26/2011 11:51:00 ET        JOB#: [id number]  cc: Denita LungLatia Holder, MD  Altamese DillingVAIBHAVKUMAR Rich Paprocki MD ELECTRONICALLY SIGNED 11/07/2011 17:53

## 2014-06-04 NOTE — Consult Note (Signed)
PATIENT NAME:  Cheryl [name], Cheryl [name] MR#:  [id number] DATE OF BIRTH:  Aug 15, 1955  DATE OF CONSULTATION:  10/23/2011  REFERRING PHYSICIAN:   CONSULTING PHYSICIAN:  Iola Turri K. Viviene Thurston, MD  SUBJECTIVE: The patient is [name] 59 year old white female not employed and on disability for lung cancer that has spread to the brain since March of 2012. The patient is separated for four years and has [name] 57 year old son who works in Set [employer]uring and lives with her. The patient was seen with her son who was in the room.   CHIEF COMPLAINT: Feeling depressed, feeling low and down, and took more fentanyl and became drowsy.   HISTORY OF PRESENT ILLNESS: According to information obtained from the son, the patient took extra fentanyl because she was sleepy and she did not have any intention to hurt herself and she did not have any memory problem but she forgot and because she was sleepy she took it and she did not mean to hurt herself.   PAST PSYCHIATRIC HISTORY: No previous history of inpatient hospitalization on Psychiatry. No history of suicide attempts. According to information obtained from the patient and her son, several [facility] was [facility] with bipolar disorder and she was given Neurontin, Seroquel, and Wellbutrin which have helped her stay stable for [name] while but she has not been taking these medications in [name] long time.   She is being followed by her cancer doctor, Dr. Orlie DakinFinnegan, who prescribed her Lexapro 10 mg p.o. daily [name] year ago when she was diagnosed with cancer and recently she has been having increased crying spells so he has increased it to Lexapro 20 mg p.o. daily.   ALCOHOL AND DRUGS: Denied.   MENTAL STATUS EXAMINATION: The patient is dressed in hospital clothes, alert and oriented to place, person, and time. Fully aware of the situation that [facility]. Does admit feeling depressed. Admits to crying spells and sometimes she cries for no  reason and thinking about her cancer. Today she reports she is feeling fine and not as depressed and did not have any crying spells today. Denies feeling hopeless and helpless except sometimes when she thinks about her cancer. Denies feeling worthless or useless. Denies suicidal or homicidal ideas or plans. No evidence of psychosis. Denies auditory or visual hallucinations. Denies any paranoid or suspicious ideas. Cognition is grossly intact. General knowledge and information intact.   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  RECOMMENDATIONS:  1. Please continue her Lexapro 20 mg p.o. daily. 2. Continue her Valium 10 mg p.o. b.i.d. 3. Recommended the patient should get Benadryl 25 mg p.o. at bedtime to help her rest at night as she reports other medications like Ambien did not help her with her sleep at night. She can be given Benadryl 25 mg p.o. at bedtime on [name] p.r.n. basis for sleep. 4. It would be of benefit if the patient can be enrolled in some group where she can get support with other cancer patients. Social Services is to be contacted to find availability of such groups in the community.  ____________________________ Jannet MantisSurya K. Guss Bundehalla, MD skc:drc D: 10/23/2011 20:17:59 ET T: 10/24/2011 11:39:45 ET JOB#: [id number]  cc: Monika SalkSurya K. Guss Bundehalla, MD, <Dictator> Beau FannySURYA K Ferron Ishmael MD ELECTRONICALLY SIGNED 10/24/2011 17:14

## 2014-06-04 NOTE — Op Note (Signed)
PATIENT NAME:  Cheryl Cheryl [name], Cheryl Cheryl [name] MR#:  [id number] DATE OF BIRTH:  1956-02-05  DATE OF PROCEDURE:  10/26/2011  PREOPERATIVE DIAGNOSIS: Lung carcinoma, metastatic.   POSTOPERATIVE DIAGNOSIS: Lung carcinoma, metastatic.   PROCEDURE PERFORMED: Insertion of left IJ Infuse-Cheryl [name]-Port.   SURGEON: Renford DillsGregory G. Lonya Johannesen, MD  SEDATION: Versed 4 mg plus fentanyl 50 mcg administered IV. Continuous ECG, pulse oximetry and cardiopulmonary monitoring was performed throughout the entire procedure by the interventional radiology nurse. Total sedation time was 45 minutes.   ACCESS: Left IJ.   CONTRAST USED: None.   FLUOROSCOPY TIME: 1.6 minutes.   INDICATIONS: Cheryl Cheryl [name] is Cheryl [name] Cheryl Cheryl [name] with right-sided lung carcinoma and lesions within the brain. She is undergoing chemotherapy and now requires appropriate IV access. Infuse-Cheryl [name]-Port is being placed. The risks and benefits were reviewed, all questions answered, and the patient agrees to proceed.   DESCRIPTION OF PROCEDURE: The patient is taken to special procedures and placed in the supine position. After adequate sedation is achieved, the left neck and chest wall are prepped and draped in sterile fashion. 1% lidocaine is then infiltrated in the soft tissues of the chest wall as well as the base of the neck. Ultrasound is placed in Cheryl [name] sterile sleeve and jugular vein is identified. It is echolucent and compressible indicating patency. Image is recorded. Under real-time visualization, the jugular vein is accessed with Cheryl [name] Seldinger needle, J-wire is then advanced, and under fluoroscopy the wire is negotiated into the inferior vena cava.   Cheryl [name] linear incision is made two fingerbreadths below the clavicle and Cheryl [name] small pocket created with both blunt and sharp dissection. After checking the pocket for appropriate size, the dilator is utilized to pull the catheter from the pocket to the neck counterincision, dilator and peel-away sheath is  then inserted over the wire, and the  catheter is advanced after removal of the wire and the dilator. Upon imaging with fluoro, the catheter is noted to course across the left innominate vein and superiorly into the right subclavian. The catheter is then pulled back into the left innominate and Cheryl [name] glidewire is utilized to advance the catheter down into the atrium. The wire is removed, the catheter is trimmed to the appropriate length, the hub is connected and then slipped subcutaneously into the pocket. The port is then accessed percutaneously with Cheryl [name] Huber needle. It aspirates easily and flushes quite well, it is checked for appropriate contour and positioning under fluoroscopy, and then the pocket is closed using interrupted 3-0 Vicryl for the subcutaneous tissues and 4-0 Monocryl subcuticular for skin closure. The neck counterincision is closed with 4-0 Monocryl subcuticular and Dermabond is applied to both sites. The patient tolerated the procedure well and there were no immediate complications. Sponge and needle counts were correct. She was taken back to recovery in excellent condition.  ____________________________ Renford DillsGregory G. Joey Hudock, MD ggs:slb D: 10/26/2011 14:50:03 ET T: 10/26/2011 15:31:58 ET JOB#: [id number]  cc: Renford DillsGregory G. Shakena Callari, MD, <Dictator> Tollie Pizzaimothy J. Orlie DakinFinnegan, MD Renford DillsGREGORY G Vannia Pola MD ELECTRONICALLY SIGNED 11/16/2011 14:04
